# Patient Record
Sex: Female | Born: 1990 | Race: White | Hispanic: No | Marital: Single | Smoking: Current every day smoker
Health system: Southern US, Community
[De-identification: ages and names within clinical notes are randomized; demographics above are authoritative.]

## PROBLEM LIST (undated history)

## (undated) DIAGNOSIS — D241 Benign neoplasm of right breast: Secondary | ICD-10-CM

## (undated) DIAGNOSIS — Z9889 Other specified postprocedural states: Secondary | ICD-10-CM

## (undated) DIAGNOSIS — I10 Essential (primary) hypertension: Secondary | ICD-10-CM

## (undated) DIAGNOSIS — J45909 Unspecified asthma, uncomplicated: Secondary | ICD-10-CM

## (undated) DIAGNOSIS — F419 Anxiety disorder, unspecified: Secondary | ICD-10-CM

## (undated) HISTORY — DX: Anxiety disorder, unspecified: F41.9

## (undated) HISTORY — PX: BREAST LUMPECTOMY: SHX2

## (undated) HISTORY — DX: Benign neoplasm of right breast: D24.1

---

## 2016-09-06 ENCOUNTER — Emergency Department
Admission: EM | Admit: 2016-09-06 | Discharge: 2016-09-06 | Disposition: A | Discharge status: Home / Self Care | Payer: Managed Care, Other (non HMO) | Source: Home / Self Care | Attending: Family Medicine | Admitting: Family Medicine

## 2016-09-06 ENCOUNTER — Emergency Department (INDEPENDENT_AMBULATORY_CARE_PROVIDER_SITE_OTHER): Payer: Managed Care, Other (non HMO)

## 2016-09-06 DIAGNOSIS — R0602 Shortness of breath: Secondary | ICD-10-CM

## 2016-09-06 DIAGNOSIS — J069 Acute upper respiratory infection, unspecified: Secondary | ICD-10-CM | POA: Diagnosis not present

## 2016-09-06 DIAGNOSIS — F17200 Nicotine dependence, unspecified, uncomplicated: Secondary | ICD-10-CM | POA: Diagnosis not present

## 2016-09-06 DIAGNOSIS — R0789 Other chest pain: Secondary | ICD-10-CM

## 2016-09-06 DIAGNOSIS — F419 Anxiety disorder, unspecified: Secondary | ICD-10-CM

## 2016-09-06 DIAGNOSIS — F410 Panic disorder [episodic paroxysmal anxiety] without agoraphobia: Secondary | ICD-10-CM

## 2016-09-06 DIAGNOSIS — B9789 Other viral agents as the cause of diseases classified elsewhere: Secondary | ICD-10-CM | POA: Diagnosis not present

## 2016-09-06 HISTORY — DX: Essential (primary) hypertension: I10

## 2016-09-06 HISTORY — DX: Other specified postprocedural states: Z98.890

## 2016-09-06 HISTORY — DX: Unspecified asthma, uncomplicated: J45.909

## 2016-09-06 LAB — POCT INFLUENZA A/B
Influenza A, POC: NEGATIVE
Influenza B, POC: NEGATIVE

## 2016-09-06 LAB — POCT RAPID STREP A (OFFICE): Rapid Strep A Screen: NEGATIVE

## 2016-09-06 NOTE — ED Provider Notes (Signed)
CSN: [id number]     Arrival date & time 09/06/16  1837 History   First MD Initiated Contact with Patient 09/06/16 1932     Chief Complaint  Patient presents with  . Otalgia    Right  . Cough  . Sore Throat   (Consider location/radiation/quality/duration/timing/severity/associated sxs/prior Treatment) HPI  Marie Randolph is a 26 y.o. female presenting to UC with c/o Right side otalgia, subjective fever, mild nausea, mild intermittent dry cough, and fatigue that started earlier today. She reports testing positive for influenza B last week and took 4 of 5 days of prescribed Tamiflu.  Pt notes she started to get nauseated so she stopped taking the Tamiflu. Pt concerned she is getting sick with the flu again. She also has a sore throat and has had strep throat twice within the last 1-2 months. Denies hx of asthma but has hx of anxiety and is wondering if symptoms are due to her anxiety. She had an appointment with a new PCP for tomorrow but was advised they could not fit her in until next week. Pt denies hx of CAD.  No vomiting or diarrhea.    Past Medical History:  Diagnosis Date  . Asthma   . H/O lumpectomy   . Hypertension    Past Surgical History:  Procedure Laterality Date  . BREAST LUMPECTOMY    . CESAREAN SECTION     Family History  Problem Relation Age of Onset  . Hypertension Mother   . Hypertension Father    Social History  Substance Use Topics  . Smoking status: Current Every Day Smoker    Types: E-cigarettes  . Smokeless tobacco: Not on file     Comment: Vapes daily  . Alcohol use Yes     Comment: 1/week   OB History    No data available     Review of Systems  Constitutional: Positive for chills, fatigue and fever ( subjective).  HENT: Positive for congestion, ear pain (Right) and sore throat. Negative for trouble swallowing and voice change.   Respiratory: Positive for cough. Negative for shortness of breath.   Cardiovascular: Negative for chest pain and  palpitations.  Gastrointestinal: Positive for nausea. Negative for abdominal pain, diarrhea and vomiting.  Musculoskeletal: Positive for arthralgias and myalgias. Negative for back pain.  Skin: Negative for rash.  Neurological: Positive for headaches. Negative for dizziness and light-headedness.  Psychiatric/Behavioral: Negative for self-injury and suicidal ideas. The patient is nervous/anxious.     Allergies  Peanut-containing drug products and Shellfish allergy  Home Medications   Prior to Admission medications   Not on File   Meds Ordered and Administered this Visit  Medications - No data to display  BP 138/85 (BP Location: Left Arm)   Pulse 72   Temp 98 F (36.7 C) (Oral)   Ht 5\' 2"  (1.575 m)   Wt 126 lb (57.2 kg)   LMP 09/03/2016   SpO2 97%   BMI 23.05 kg/m  No data found.   Physical Exam  Constitutional: She is oriented to person, place, and time. She appears well-developed and well-nourished. No distress.  HENT:  Head: Normocephalic and atraumatic.  Right Ear: Tympanic membrane normal.  Left Ear: Tympanic membrane normal.  Nose: Nose normal.  Mouth/Throat: Uvula is midline, oropharynx is clear and moist and mucous membranes are normal.  Partially impacted, no TM erythema or edema. No mastoid tenderness.   Eyes: EOM are normal.  Neck: Normal range of motion. Neck supple.  Cardiovascular: Normal rate and  regular rhythm.   Pulmonary/Chest: Effort normal and breath sounds normal. No stridor. No respiratory distress. She has no wheezes. She has no rales. She exhibits no tenderness.  Musculoskeletal: Normal range of motion.  Lymphadenopathy:    She has no cervical adenopathy.  Neurological: She is alert and oriented to person, place, and time.  Skin: Skin is warm and dry. She is not diaphoretic.  Psychiatric: Her behavior is normal. Her mood appears anxious. She exhibits a depressed mood (tearful).  Nursing note and vitals reviewed.   Urgent Care Course      Procedures (including critical care time)  Labs Review Labs Reviewed  POCT RAPID STREP A (OFFICE)  POCT INFLUENZA A/B    Imaging Review Dg Chest 2 View  Result Date: 09/06/2016 CLINICAL DATA:  26 year old current smoker with acute onset of shortness of breath which began earlier today. Patient states that she had influenza 1 week ago. EXAM: CHEST  2 VIEW COMPARISON:  None. FINDINGS: Cardiomediastinal silhouette unremarkable. Lungs clear. Bronchovascular markings normal. Pulmonary vascularity normal. No visible pleural effusions. No pneumothorax. Thoracolumbar dextroscoliosis. IMPRESSION: No acute cardiopulmonary disease. Electronically Signed   By: Evangeline Dakin M.D.   On: 09/06/2016 19:25    MDM   1. Viral URI with cough   2. Chest tightness   3. Anxiety   4. Panic attack    Pt presenting to UC concerned for having flu again or other sort of infection.  Rapid flu: Negative CXR: normal Rapid strep: Negative  Hx and exam c/w viral URI and associated anxiety/panic attack.  Doubt emergent process taking place at this time. Vitals: WNL Strongly encouraged to call PCP in the morning for further evaluation of symptoms and to discussed anxiety.  Discussed symptoms that warrant emergent care in the ED.     Noland Fordyce, PA-C 09/06/16 2039

## 2016-09-06 NOTE — ED Triage Notes (Signed)
Had strep 4 weeks ago, then again 2 weeks ago.  Had flu B last week.  Started feeling bad around noon today.  Right ear pain, cough.

## 2016-09-09 ENCOUNTER — Encounter: Payer: Self-pay | Admitting: Physician Assistant

## 2016-09-09 ENCOUNTER — Ambulatory Visit (INDEPENDENT_AMBULATORY_CARE_PROVIDER_SITE_OTHER): Payer: Managed Care, Other (non HMO) | Admitting: Physician Assistant

## 2016-09-09 VITALS — BP 122/80 | HR 87 | Wt 128.0 lb

## 2016-09-09 DIAGNOSIS — F411 Generalized anxiety disorder: Secondary | ICD-10-CM | POA: Diagnosis not present

## 2016-09-09 DIAGNOSIS — Z0000 Encounter for general adult medical examination without abnormal findings: Secondary | ICD-10-CM | POA: Diagnosis not present

## 2016-09-09 DIAGNOSIS — N6311 Unspecified lump in the right breast, upper outer quadrant: Secondary | ICD-10-CM

## 2016-09-09 DIAGNOSIS — R030 Elevated blood-pressure reading, without diagnosis of hypertension: Secondary | ICD-10-CM | POA: Insufficient documentation

## 2016-09-09 MED ORDER — FLUOXETINE HCL 20 MG PO TABS: 20.0000 mg | ORAL_TABLET | Freq: Every day | ORAL | 1 refills | Status: DC

## 2016-09-09 MED ORDER — DIAZEPAM 2 MG PO TABS: ORAL_TABLET | ORAL | 0 refills | Status: AC

## 2016-09-09 NOTE — Progress Notes (Signed)
HPI:                                                                Marie Randolph is a 26 y.o. female who presents to [place] today to establish care  Current Concerns include anxiety, fatigue, weight gain, breast lump  Patient was seen in urgent care 3 days ago for chest tightness and diagnosed with anxiety/panic attack. She was encouraged to follow-up with a PCP. She has an upcoming trip to [place] for business at the end of the week and she is very anxious about air travel. Patient reports a long-standing history of anxiety. She has been on SSRI's, Hydroxyzine and attending counseling and states "none of these work." She states that her anxiety ruined her relationship with her child's father and interferes with her quality of life. She does find some support and relief through her church. She exercises daily.  Weight gain: patient is concerned that she weighs 128 pounds. She states that she has always weighed 124 pounds and that this is not normal for her. She adheres to a very strict diet and exercises regularly. She believes there is something wrong with her thyroid and would like to have it checked.  Right breast lumps: patient has a history of fibroadenomas of the right breast. Over the past year she has noticed two firm masses in the upper outer right breast near her surgical scar. They are nontender, but are bothering her.    Health Maintenance Health Maintenance  Topic Date Due  . HIV Screening  08/23/2004  . TETANUS/TDAP  08/23/2008  . PAP SMEAR  08/23/2010  . INFLUENZA VACCINE  03/02/2016    GYN/Sexual Health  Menstrual status: IUD  LMP: 09/03/16  Menses: spotting  Last pap smear: Oct 2017  History of abnormal pap smears: no  Sexually active: not currenlty  Current contraception: IUD  Health Habits  Diet: good  Exercise: 5 days per week, cardio and weights   Past Medical History:  Diagnosis Date  .  Anxiety   . Asthma   . H/O lumpectomy   . Hypertension    Past Surgical History:  Procedure Laterality Date  . BREAST LUMPECTOMY    . CESAREAN SECTION     Social History  Substance Use Topics  . Smoking status: Current Every Day Smoker    Types: E-cigarettes  . Smokeless tobacco: Not on file     Comment: Vapes daily  . Alcohol use Yes     Comment: 1/week   family history includes Alcohol abuse in her father; Breast cancer in her maternal aunt; Diabetes in her maternal grandmother; Hyperlipidemia in her mother; Hypertension in her father and mother.  ROS: negative except as noted in the HPI  Medications: Current Outpatient Prescriptions  Medication Sig Dispense Refill  . diazepam (VALIUM) 2 MG tablet Take 1 hour prior to air travel 10 tablet 0  . FLUoxetine (PROZAC) 20 MG tablet Take 1 tablet (20 mg total) by mouth daily. 30 tablet 1   No current facility-administered medications for this visit.    Allergies  Allergen Reactions  . Peanut-Containing Drug Products   . Shellfish Allergy        Objective:  BP 122/80   Pulse 87  Wt 128 lb (58.1 kg)   LMP 09/03/2016   BMI 23.41 kg/m  Gen: well-groomed, cooperative, not ill-appearing, no distress HEENT: normal conjunctiva, TM's clear, oropharynx clear, moist mucus membranes, no thyromegaly or tenderness Pulm: Normal work of breathing, clear to auscultation bilaterally CV: Normal rate, regular rhythm, s1 and s2 distinct, no murmurs, clicks or rubs appreciated on this exam, no carotid bruit Right Breast: 2, firm approx 0.5cm nodules in the upper outer quadrant, no axillary or supraclavicular adenopathy present GI: soft, nondistended, nontender, no masses Neuro: alert and oriented x 3, EOM's intact, PERRLA, DTR's intact MSK: strength 5/5 and symmetric, normal gait, distal pulses intact, no peripheral edema Skin: warm and dry, no rashes or lesions on exposed skin Psych: normal affect, pleasant mood, normal speech and  thought content  GAD 7 : Generalized Anxiety Score 09/09/2016  Nervous, Anxious, on Edge 3  Control/stop worrying 3  Worry too much - different things 3  Trouble relaxing 3  Restless 0  Easily annoyed or irritable 3  Afraid - awful might happen 2  Total GAD 7 Score 17    Assessment and Plan: 26 y.o. female with   1. Encounter for preventive health examination - TSH - VITAMIN D 25 Hydroxy (Vit-D Deficiency, Fractures) - Lipid Panel w/reflex Direct LDL - Comprehensive metabolic panel - CBC  2. Mass of upper outer quadrant of right breast - suspect recurrence of fibroadenoma  - US BREAST LTD UNI RIGHT INC AXILLA; Future  3. 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 Instructed not to drink alcohol or drive with this medication. Not to combine with other sedation medications. Patient expressed understanding - close follow-up in 1 month - FLUoxetine (PROZAC) 20 MG tablet; Take 1 tablet (20 mg total) by mouth daily.  Dispense: 30 tablet; Refill: 1 - diazepam (VALIUM) 2 MG tablet; Take 1 hour prior to air travel  Dispense: 10 tablet; Refill: 0  4. Elevated blood pressure reading - recheck BP improved, but she does have a family history of HTN - patient to check blood pressures at home - return with readings in 1 month  Patient education and anticipatory guidance given Patient agrees with treatment plan Follow-up in 1 month or sooner as needed  Darlyne Russian PA-C

## 2016-09-09 NOTE — Patient Instructions (Addendum)
Take 1 Valium 1 hour prior to your flight. May repeat once. Do not drink any alcohol with this medication. Do not drive while taking this medication. Do not combine with any other sedating medicines.  Start Fluoxetine 20mg  daily  Purchase a blood pressure cuff and start checking your pressures at home. Choose the same time of day when you are relaxed. Bring log to your next appointment  Follow-up with me in 1 month   How to Take Your Blood Pressure Blood pressure is a measurement of how strongly your blood is pressing against the walls of your arteries. Arteries are blood vessels that carry blood from your heart throughout your body. Your health care provider takes your blood pressure at each office visit. You can also take your own blood pressure at home with a blood pressure machine. You may need to take your own blood pressure:  To confirm a diagnosis of high blood pressure (hypertension).  To monitor your blood pressure over time.  To make sure your blood pressure medicine is working. Supplies needed: To take your blood pressure, you will need a blood pressure machine. You can buy a blood pressure machine, or blood pressure monitor, at most drugstores or online. There are several types of home blood pressure monitors. When choosing one, consider the following:  Choose a monitor that has an arm cuff.  Choose a monitor that wraps snugly around your upper arm. You should be able to fit only one finger between your arm and the cuff.  Do not choose a monitor that measures your blood pressure from your wrist or finger. Your health care provider can suggest a reliable monitor that will meet your needs. How to prepare To get the most accurate reading, avoid the following for 30 minutes before you check your blood pressure:  Drinking caffeine.  Drinking alcohol.  Eating.  Smoking.  Exercising. Five minutes before you check your blood pressure:  Empty your bladder.  Sit quietly  without talking in a dining chair, rather than in a soft couch or armchair. How to take your blood pressure To check your blood pressure, follow the instructions in the manual that came with your blood pressure monitor. If you have a digital blood pressure monitor, the instructions may be as follows: 1. Sit up straight. 2. Place your feet on the floor. Do not cross your ankles or legs. 3. Rest your left arm at the level of your heart on a table or desk or on the arm of a chair. 4. Pull up your shirt sleeve. 5. Wrap the blood pressure cuff around the upper part of your left arm, 1 inch (2.5 cm) above your elbow. It is best to wrap the cuff around bare skin. 6. Fit the cuff snugly around your arm. You should be able to place only one finger between the cuff and your arm. 7. Position the cord inside the groove of your elbow. 8. Press the power button. 9. Sit quietly while the cuff inflates and deflates. 10. Read the digital reading on the monitor screen and write it down (record it). 11. Wait 2-3 minutes, then repeat the steps starting at step 1. What does my blood pressure reading mean? A blood pressure reading is recorded as two numbers, such as "120 over 80" (or 120/80). The first ("top") number is called the systolic pressure. It is a measure of the pressure in your arteries as the heart beats. The second ("bottom") number is called the diastolic pressure. It is a  measure of the pressure in your arteries as the heart relaxes between beats. Blood pressure is classified into four stages. The following are the stages for adults who do not have a short-term serious illness or a chronic condition. Systolic pressure and diastolic pressure are measured in a unit called mm Hg. Normal  Systolic pressure: below 123456.  Diastolic pressure: below 80. Prehypertension  Systolic pressure: 123456.  Diastolic pressure: [id number]. Hypertension stage 1  Systolic pressure: A999333.  Diastolic pressure:  A999333. Hypertension stage 2  Systolic pressure: 0000000 or above.  Diastolic pressure: 123XX123 or above. You can have prehypertension or hypertension even if only the systolic or only the diastolic number in your reading is higher than normal. Follow these instructions at home:  Check your blood pressure as often as recommended by your health care provider.  Take your monitor to the next appointment with your health care provider to make sure:  That you are using it correctly.  That it provides accurate readings.  Be sure you understand what your goal blood pressure numbers are.  Tell your health care provider if you are having any side effects from blood pressure medicine. Contact a health care provider if:  Your blood pressure is consistently high. Get help right away if:  Your systolic blood pressure is higher than 180.  Your diastolic blood pressure is higher than 110. This information is not intended to replace advice given to you by your health care provider. Make sure you discuss any questions you have with your health care provider. Document Released: 12/26/2015 Document Revised: 03/09/2016 Document Reviewed: 12/26/2015 Elsevier Interactive Patient Education  2017 [employer].

## 2016-09-13 ENCOUNTER — Encounter: Payer: Self-pay | Admitting: Physician Assistant

## 2016-09-23 ENCOUNTER — Telehealth: Payer: Self-pay | Admitting: Physician Assistant

## 2016-09-23 LAB — LIPID PANEL W/REFLEX DIRECT LDL
CHOL/HDL RATIO: 2.4 ratio (ref <5.0)
Cholesterol: 127 mg/dL (ref <200)
HDL: 52 mg/dL (ref >50)
LDL-Cholesterol: 56 mg/dL
Non-HDL Cholesterol (Calc): 75 mg/dL (ref <130)
Triglycerides: 107 mg/dL (ref <150)

## 2016-09-23 LAB — COMPREHENSIVE METABOLIC PANEL
ALBUMIN: 4.4 g/dL (ref 3.6–5.1)
ALK PHOS: 56 U/L (ref 33–115)
ALT: 15 U/L (ref 6–29)
AST: 17 U/L (ref 10–30)
BILIRUBIN TOTAL: 0.5 mg/dL (ref 0.2–1.2)
BUN: 12 mg/dL (ref 7–25)
CALCIUM: 9.2 mg/dL (ref 8.6–10.2)
CO2: 23 mmol/L (ref 20–31)
CREATININE: 0.55 mg/dL (ref 0.50–1.10)
Chloride: 105 mmol/L (ref 98–110)
Glucose, Bld: 79 mg/dL (ref 65–99)
Potassium: 4.1 mmol/L (ref 3.5–5.3)
Sodium: 139 mmol/L (ref 135–146)
TOTAL PROTEIN: 7.4 g/dL (ref 6.1–8.1)

## 2016-09-23 LAB — CBC
HCT: 42.1 % (ref 35.0–45.0)
Hemoglobin: 14.2 g/dL (ref 11.7–15.5)
MCH: 32.3 pg (ref 27.0–33.0)
MCHC: 33.7 g/dL (ref 32.0–36.0)
MCV: 95.7 fL (ref 80.0–100.0)
MPV: 9.7 fL (ref 7.5–12.5)
PLATELETS: 303 10*3/uL (ref 140–400)
RBC: 4.4 MIL/uL (ref 3.80–5.10)
RDW: 13.1 % (ref 11.0–15.0)
WBC: 8.1 10*3/uL (ref 3.8–10.8)

## 2016-09-23 LAB — VITAMIN D 25 HYDROXY (VIT D DEFICIENCY, FRACTURES): VIT D 25 HYDROXY: 27 ng/mL — AB (ref 30–100)

## 2016-09-23 LAB — TSH: TSH: 0.87 m[IU]/L

## 2016-09-23 NOTE — Telephone Encounter (Signed)
Patient called about her labs and really would like for a nurse to call her asap about her results. Thanks

## 2016-09-23 NOTE — Telephone Encounter (Signed)
Thanks, [place]. Her labs were drawn yesterday and resulted today. Should she call you back, she did not have any results that were flagged abnormal, so she will get a call from a nurse/CMA by end of day today or tomorrow. You can also let her know that they were released to MyChart, so she can activate that and view them there as well.

## 2016-10-06 ENCOUNTER — Other Ambulatory Visit: Payer: Managed Care, Other (non HMO)

## 2016-10-07 ENCOUNTER — Ambulatory Visit: Payer: Managed Care, Other (non HMO) | Admitting: Physician Assistant

## 2016-11-02 ENCOUNTER — Other Ambulatory Visit: Payer: Managed Care, Other (non HMO)

## 2016-12-04 ENCOUNTER — Other Ambulatory Visit: Payer: Self-pay | Admitting: Physician Assistant

## 2016-12-04 DIAGNOSIS — F411 Generalized anxiety disorder: Secondary | ICD-10-CM

## 2017-07-17 IMAGING — DX DG CHEST 2V
2 series · 2 of 2 positions shown · non-contrast
Comparison: None.

CLINICAL DATA: 26-year-old current smoker with acute onset of
shortness of breath which began earlier today. Patient states that
she had influenza 1 week ago.

EXAM:
CHEST  2 VIEW

[chest pa]
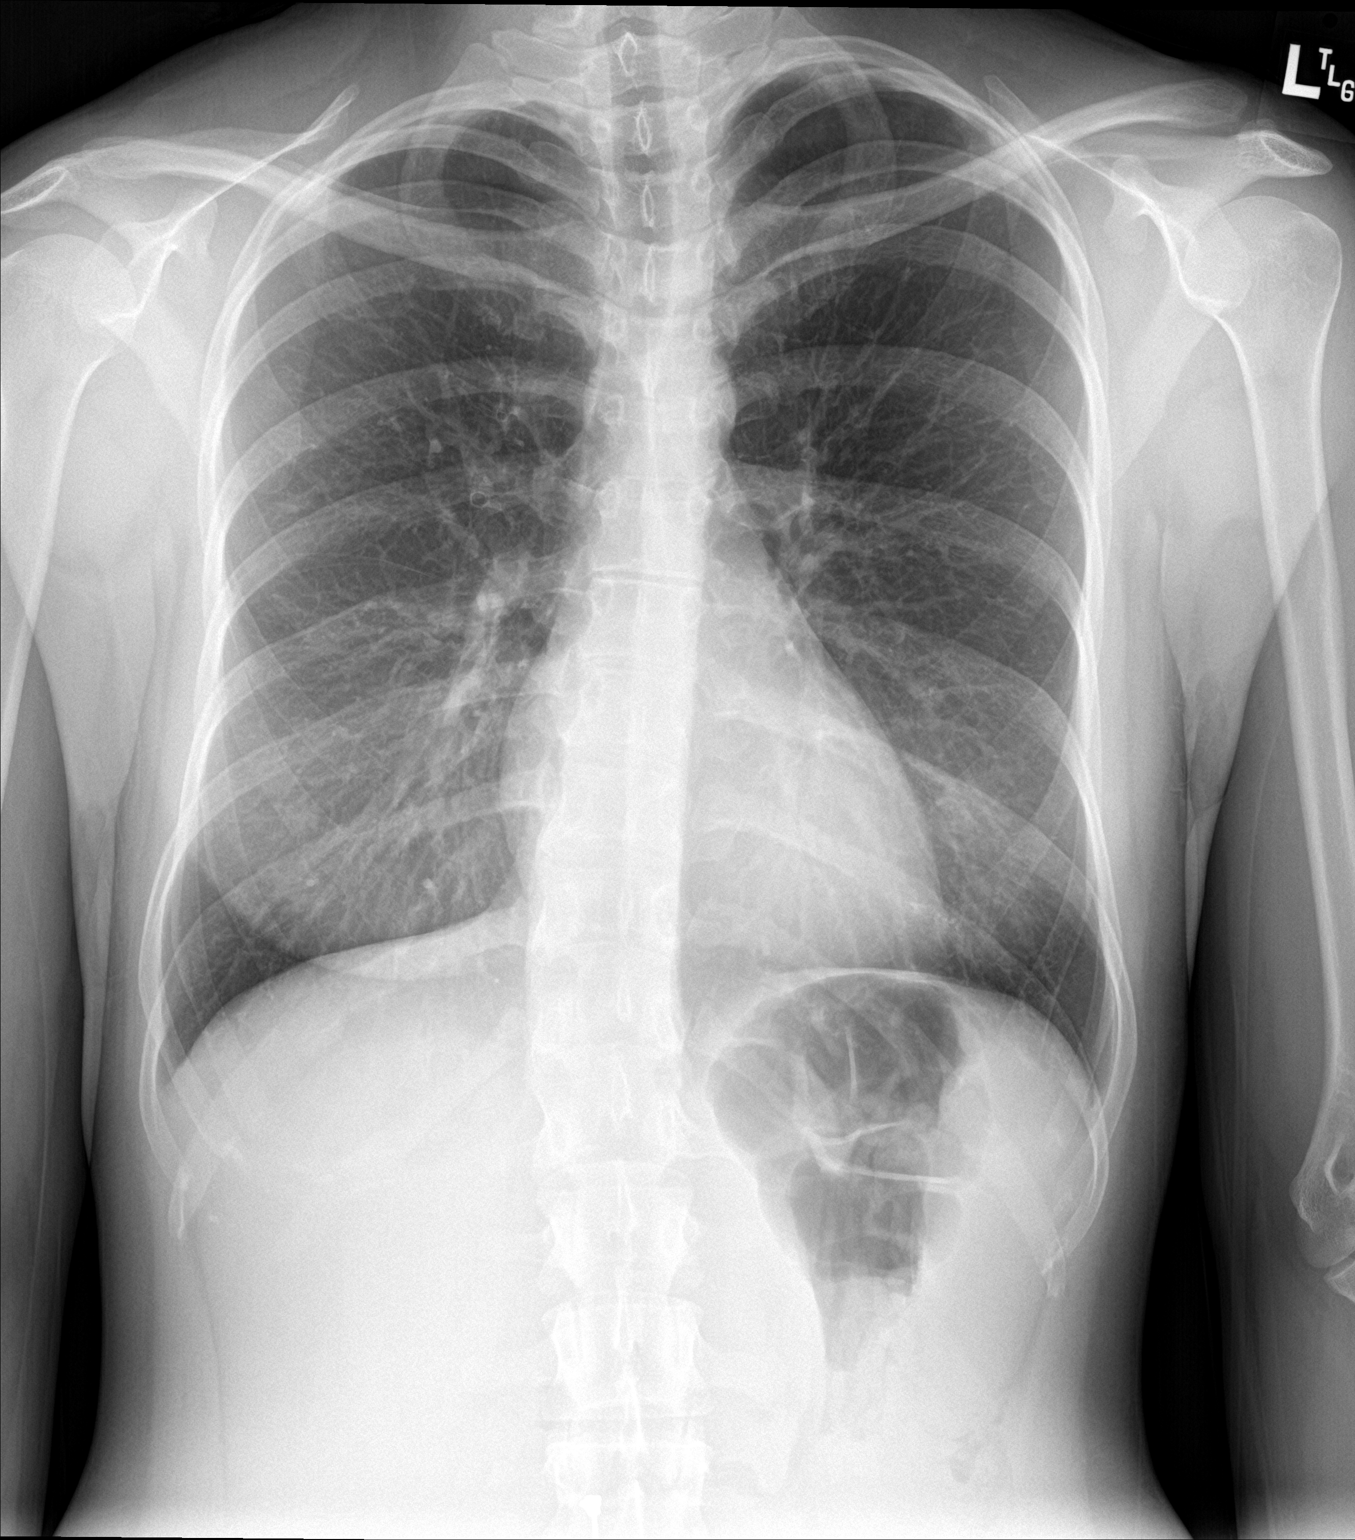

[chest lat]
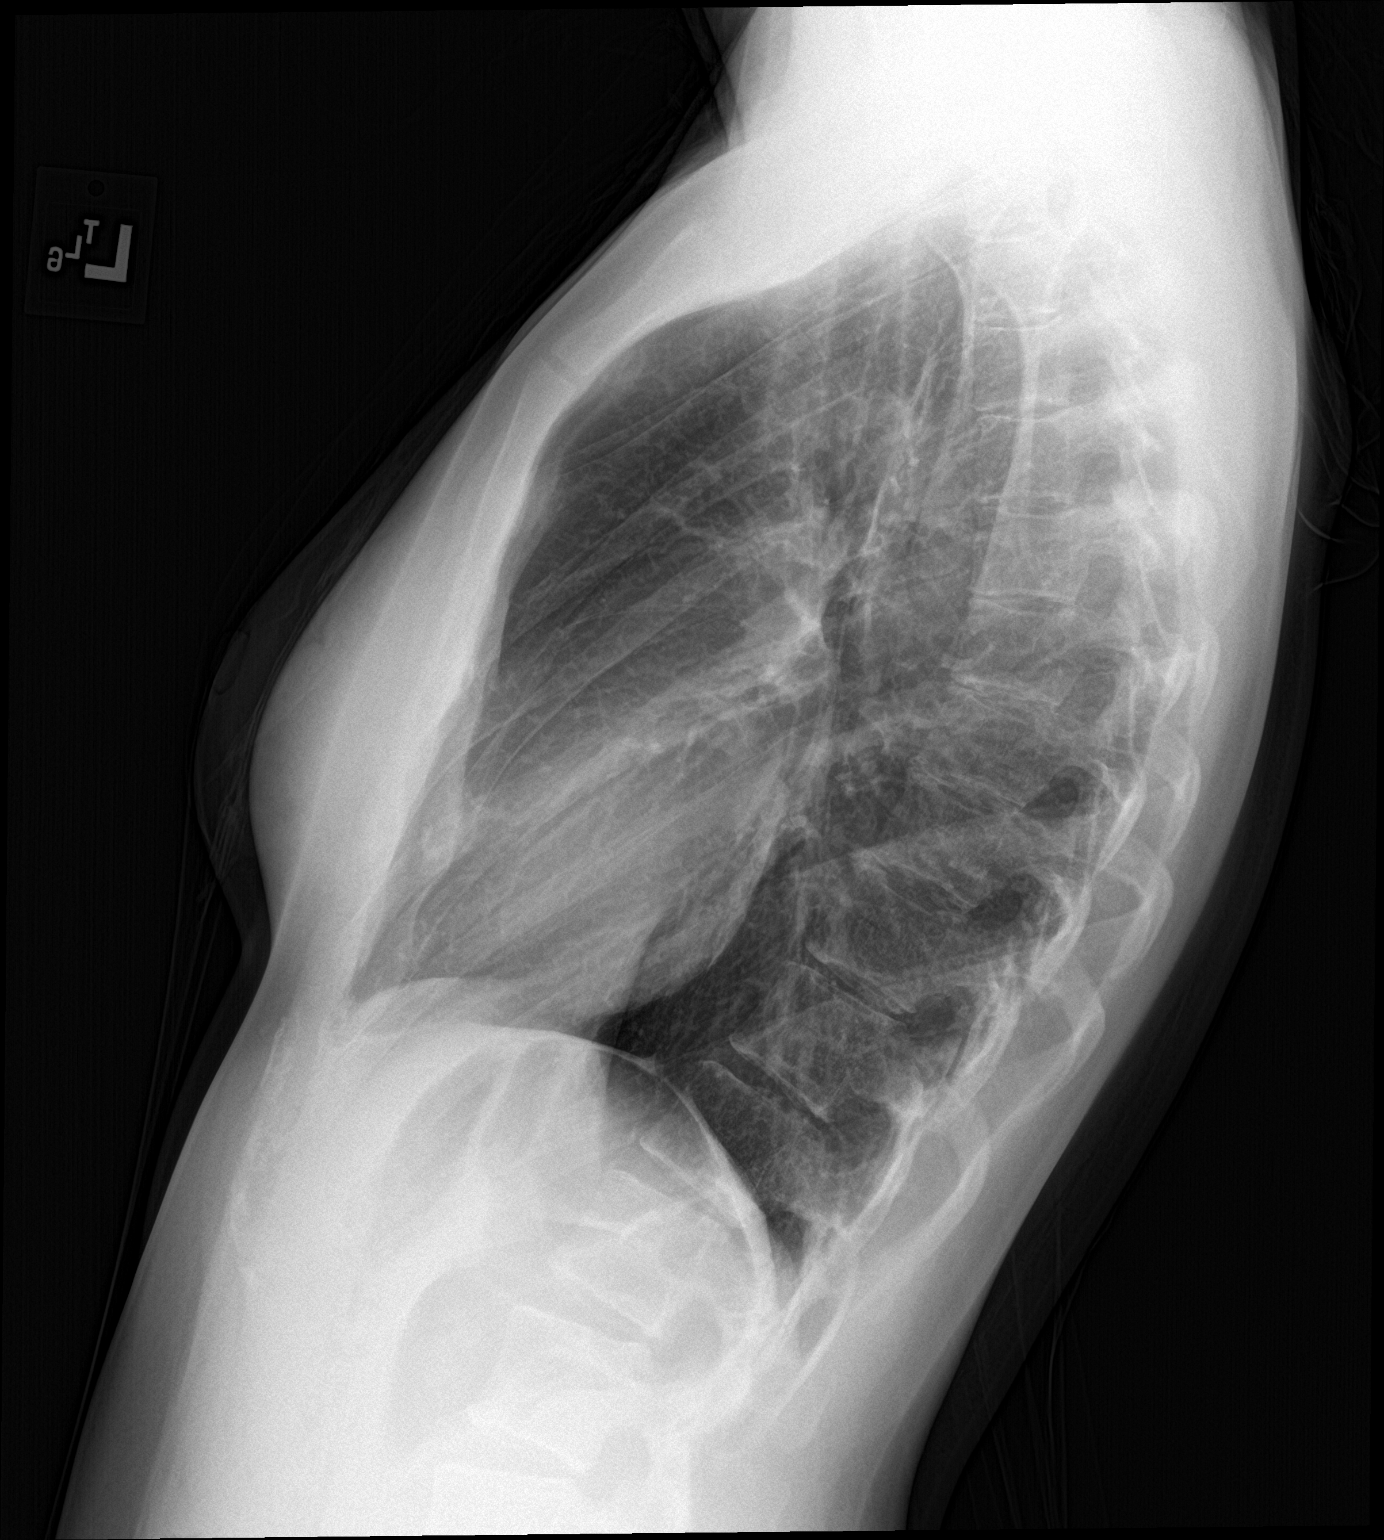

[2 of 2 positions shown; findings below may reference images not displayed]

FINDINGS: Cardiomediastinal silhouette unremarkable. Lungs clear.
Bronchovascular markings normal. Pulmonary vascularity normal. No
visible pleural effusions. No pneumothorax. Thoracolumbar
dextroscoliosis.
IMPRESSION: No acute cardiopulmonary disease.

## 2017-12-24 ENCOUNTER — Other Ambulatory Visit: Payer: Self-pay

## 2017-12-24 ENCOUNTER — Emergency Department (INDEPENDENT_AMBULATORY_CARE_PROVIDER_SITE_OTHER)
Admission: EM | Admit: 2017-12-24 | Discharge: 2017-12-24 | Disposition: A | Discharge status: Home / Self Care | Payer: Managed Care, Other (non HMO) | Source: Home / Self Care | Attending: Emergency Medicine | Admitting: Emergency Medicine

## 2017-12-24 DIAGNOSIS — H1031 Unspecified acute conjunctivitis, right eye: Secondary | ICD-10-CM | POA: Diagnosis not present

## 2017-12-24 DIAGNOSIS — H00011 Hordeolum externum right upper eyelid: Secondary | ICD-10-CM

## 2017-12-24 MED ORDER — CEPHALEXIN 500 MG PO CAPS: 500.0000 mg | ORAL_CAPSULE | Freq: Two times a day (BID) | ORAL | 0 refills | Status: DC

## 2017-12-24 MED ORDER — GENTAMICIN SULFATE 0.3 % OP SOLN: 2.0000 [drp] | OPHTHALMIC | 0 refills | Status: DC

## 2017-12-24 MED ORDER — POLYMYXIN B-TRIMETHOPRIM 10000-0.1 UNIT/ML-% OP SOLN: OPHTHALMIC | 0 refills | Status: DC

## 2017-12-24 NOTE — ED Provider Notes (Signed)
[facility]    CSN: [id number] Arrival date & time: 12/24/17  1345     History   Chief Complaint Chief Complaint  Patient presents with  . Conjunctivitis    HPI Jalaysha Skilton is a 27 y.o. female.   HPI Pt c/o swollen R eyelid for 3 days.  It is getting red and irritated.  Started draining slight yellow discharge yesterday.  This morning, woke up with eye swollen shut .  She feels right upper lid stye, feels inflamed and enlarging. No other head or HEENT symptoms.  No fever or chills or nausea or vomiting.  Currently, denies chest pain or shortness of breath.  No abdominal pain.  No focal neurologic symptoms or syncope. Denies vision problems.   Past Medical History:  Diagnosis Date  . Anxiety   . Asthma   . Fibroadenoma of right breast   . H/O lumpectomy   . Hypertension     Patient Active Problem List   Diagnosis Date Noted  . Mass of upper outer quadrant of right breast 09/09/2016  . GAD (generalized anxiety disorder) 09/09/2016  . Elevated blood pressure reading 09/09/2016    Past Surgical History:  Procedure Laterality Date  . BREAST LUMPECTOMY    . CESAREAN SECTION      OB History   None   Patient's last menstrual period was 12/09/2017. She denies chance of pregnancy   Home Medications    Prior to Admission medications   Medication Sig Start Date End Date Taking? Authorizing Provider  cephALEXin (KEFLEX) 500 MG capsule Take 1 capsule (500 mg total) by mouth 2 (two) times daily for 7 days. 12/24/17 12/31/17  Jacqulyn Cane, MD  diazepam (VALIUM) 2 MG tablet Take 1 hour prior to air travel 09/09/16   Trixie Dredge, PA-C  FLUoxetine (PROZAC) 20 MG tablet Take 1 tablet (20 mg total) by mouth daily. Due for follow up visit 12/06/16   Trixie Dredge, PA-C  gentamicin (GARAMYCIN) 0.3 % ophthalmic solution Place 2 drops into the right eye every 4 (four) hours. For 7 days . 12/24/17   Jacqulyn Cane, MD    Family  History Family History  Problem Relation Age of Onset  . Hypertension Mother   . Hyperlipidemia Mother   . Hypertension Father   . Alcohol abuse Father   . Breast cancer Maternal Aunt   . Diabetes Maternal Grandmother     Social History Social History   Tobacco Use  . Smoking status: Current Every Day Smoker    Types: E-cigarettes  . Smokeless tobacco: Never Used  . Tobacco comment: Vapes daily  Substance Use Topics  . Alcohol use: Yes    Comment: 1/week  . Drug use: No     Allergies   Peanut-containing drug products and Shellfish allergy   Review of Systems Review of Systems  All other systems reviewed and are negative.  See also HPI  Physical Exam Triage Vital Signs ED Triage Vitals [12/24/17 1416]  Enc Vitals Group     BP 120/82     Pulse Rate 83     Resp      Temp 98.4 F (36.9 C)     Temp Source Oral     SpO2 98 %     Weight 146 lb (66.2 kg)     Height 5\' 2"  (1.575 m)     Head Circumference      Peak Flow      Pain Score 0  Pain Loc      Pain Edu?      Excl. in [place]?    No data found.  Updated Vital Signs BP 120/82 (BP Location: Right Arm)   Pulse 83   Temp 98.4 F (36.9 C) (Oral)   Ht 5\' 2"  (1.575 m)   Wt 146 lb (66.2 kg)   LMP 12/09/2017   SpO2 98%   BMI 26.70 kg/m   Visual Acuity Right Eye Distance: 20/40 Left Eye Distance: 20/30 Bilateral Distance: 20/30  Right Eye Near:   Left Eye Near:    Bilateral Near:     Physical Exam  Constitutional: She is oriented to person, place, and time. She appears well-developed and well-nourished. No distress.  HENT:  Head: Normocephalic and atraumatic. Head is without right periorbital erythema and without left periorbital erythema.  Right Ear: Tympanic membrane, external ear and ear canal normal.  Left Ear: Tympanic membrane, external ear and ear canal normal.  Nose: Nose normal.  Mouth/Throat: Oropharynx is clear and moist. No oral lesions.  Eyes: Pupils are equal, round, and reactive  to light. EOM are normal. Right eye exhibits discharge and exudate. Right eye exhibits no hordeolum. No foreign body present in the right eye. Left eye exhibits no discharge, no exudate and no hordeolum. No foreign body present in the left eye. Right conjunctiva is injected. Right conjunctiva has no hemorrhage. Left conjunctiva is not injected. Left conjunctiva has no hemorrhage.  There is a inflamed, rubbery red stye 4 x 4 mm right upper eyelid, mildly tender to palpation.  No drainage from the stye.  No fluctuance  Neck: Neck supple.  Lymphadenopathy:    She has no cervical adenopathy.  Neurological: She is alert and oriented to person, place, and time.  Skin: No rash noted.  Psychiatric: She has a normal mood and affect.     UC Treatments / Results  Labs (all labs ordered are listed, but only abnormal results are displayed) Labs Reviewed - No data to display  EKG None  Radiology No results found.  Procedures Procedures (including critical care time)  Medications Ordered in UC Medications - No data to display  Initial Impression / Assessment and Plan / UC Course  I have reviewed the triage vital signs and the nursing notes.  Pertinent labs & imaging results that were available during my care of the patient were reviewed by me and considered in my medical decision making (see chart for details).     Final Clinical Impressions(s) / UC Diagnoses   Final diagnoses:  Acute bacterial conjunctivitis of right eye  Hordeolum externum of right upper eyelid     Discharge Instructions     Cause for your right eye pink eye is likely bacterial conjunctivitis and infected sty right upper lid. Please read attached information sheets on conjunctivitis and stye. Using eyedrops as prescribed. Take the cephalexin antibiotic by mouth twice a day for 7 days. Apply warm compresses to right eye twice a day. Follow-up with your eye doctor if not improving in one week, or sooner if worse or  new symptoms.   AVS printed and given to patient and verbal instructions given. Questions invited and answered. After patient was discharged, pharmacy called stating that no Polytrim eyedrops available, therefore changed prescription for Polytrim eyedrops to gentamicin eyedrops.  Pharmacist conveyed information to patient. ED Prescriptions    Medication Sig Dispense Auth. Provider   trimethoprim-polymyxin b (POLYTRIM) ophthalmic solution  (Status: Discontinued) 2 drop in affected eye(s) every 4  hours (while awake) x 7 days 10 mL Jacqulyn Cane, MD   cephALEXin (KEFLEX) 500 MG capsule Take 1 capsule (500 mg total) by mouth 2 (two) times daily for 7 days. 14 capsule Jacqulyn Cane, MD   gentamicin (GARAMYCIN) 0.3 % ophthalmic solution Place 2 drops into the right eye every 4 (four) hours. For 7 days . 5 mL Jacqulyn Cane, MD        Jacqulyn Cane, MD 12/24/17 [phone]

## 2017-12-24 NOTE — ED Triage Notes (Signed)
Pt c/o swollen R eyelid since Wednesday. Irritated and draining. Woke up with eye swollen shut this morning. Continues to get worse and possibly traveling to other eye.

## 2017-12-24 NOTE — Discharge Instructions (Signed)
Cause for your right eye pink eye is likely bacterial conjunctivitis and infected sty right upper lid. Please read attached information sheets on conjunctivitis and stye. Using eyedrops as prescribed. Take the cephalexin antibiotic by mouth twice a day for 7 days. Apply warm compresses to right eye twice a day. Follow-up with your eye doctor if not improving in one week, or sooner if worse or new symptoms.

## 2017-12-26 ENCOUNTER — Other Ambulatory Visit: Payer: Self-pay

## 2017-12-26 ENCOUNTER — Telehealth: Payer: Self-pay | Admitting: *Deleted

## 2017-12-26 ENCOUNTER — Emergency Department (INDEPENDENT_AMBULATORY_CARE_PROVIDER_SITE_OTHER)
Admission: EM | Admit: 2017-12-26 | Discharge: 2017-12-26 | Disposition: A | Discharge status: Home / Self Care | Payer: Managed Care, Other (non HMO) | Source: Home / Self Care

## 2017-12-26 DIAGNOSIS — R21 Rash and other nonspecific skin eruption: Secondary | ICD-10-CM

## 2017-12-26 MED ORDER — DOXYCYCLINE HYCLATE 100 MG PO TABS: 100.0000 mg | ORAL_TABLET | Freq: Two times a day (BID) | ORAL | 0 refills | Status: AC

## 2017-12-26 MED ORDER — VALACYCLOVIR HCL 1 G PO TABS: 1000.0000 mg | ORAL_TABLET | Freq: Three times a day (TID) | ORAL | 0 refills | Status: AC

## 2017-12-26 NOTE — Telephone Encounter (Signed)
Pt called reports that she is still having eye drainage and itching. She has now developed whelps under her eye. She is concerned that se may be having an allergic reaction. Advised her to return to the clinic for reevaluation.

## 2017-12-26 NOTE — ED Triage Notes (Signed)
Pt saw Dr Burnett Harry 2 days ago, and this am has 2 sores under the right eye

## 2017-12-26 NOTE — ED Provider Notes (Signed)
[place]   [id number] 12/26/17 Arrival Time: 1520   SUBJECTIVE:  Marie Randolph is a 27 y.o. female who presents to the urgent care with complaint of rash on lower right eyelid.  Patient states that this eruption just began today after having a stye and conjunctivitis diagnosed two days ago.  The eruption is accompanied by pain in the entire right lower lid and on closer inspection includes 2 small vesicles.  Although patient is 27 years old, she did have chickenpox when she was young.  Past Medical History:  Diagnosis Date  . Anxiety   . Asthma   . Fibroadenoma of right breast   . H/O lumpectomy   . Hypertension    Family History  Problem Relation Age of Onset  . Hypertension Mother   . Hyperlipidemia Mother   . Hypertension Father   . Alcohol abuse Father   . Breast cancer Maternal Aunt   . Diabetes Maternal Grandmother    Social History   Socioeconomic History  . Marital status: Single    Spouse name: Not on file  . Number of children: Not on file  . Years of education: Not on file  . Highest education level: Not on file  Occupational History  . Not on file  Social Needs  . Financial resource strain: Not on file  . Food insecurity:    Worry: Not on file    Inability: Not on file  . Transportation needs:    Medical: Not on file    Non-medical: Not on file  Tobacco Use  . Smoking status: Current Every Day Smoker    Types: E-cigarettes  . Smokeless tobacco: Never Used  . Tobacco comment: Vapes daily  Substance and Sexual Activity  . Alcohol use: Yes    Comment: 1/week  . Drug use: No  . Sexual activity: Yes    Birth control/protection: IUD  Lifestyle  . Physical activity:    Days per week: Not on file    Minutes per session: Not on file  . Stress: Not on file  Relationships  . Social connections:    Talks on phone: Not on file    Gets together: Not on file    Attends religious service: Not on file    Active member of club or  organization: Not on file    Attends meetings of clubs or organizations: Not on file    Relationship status: Not on file  . Intimate partner violence:    Fear of current or ex partner: Not on file    Emotionally abused: Not on file    Physically abused: Not on file    Forced sexual activity: Not on file  Other Topics Concern  . Not on file  Social History Narrative  . Not on file   No outpatient medications have been marked as taking for the 12/26/17 encounter [facility] Encounter).   Allergies  Allergen Reactions  . Peanut-Containing Drug Products   . Penicillins   . Shellfish Allergy       ROS: As per HPI, remainder of ROS negative.   OBJECTIVE:   Vitals:   12/26/17 1535 12/26/17 1537  BP: 118/76   Pulse: 90   Temp: 98.4 F (36.9 C)   TempSrc: Oral   SpO2: 100%   Weight:  148 lb (67.1 kg)  Height:  5\' 2"  (1.575 m)     General appearance: alert; no distress Eyes: PERRL; EOMI; conjunctiva normal; right lower lid is mildly swollen, diffusely erythematous,  in the center of the erythema contains 2 small 1 mm vesicles. HENT: normocephalic; atraumatic; oral mucosa normal Neck: supple Back: no CVA tenderness Extremities: no cyanosis or edema; symmetrical with no gross deformities Skin: warm and dry Neurologic: normal gait; grossly normal Psychological: alert and cooperative; normal mood and affect      Labs:  Results for orders placed or performed in visit on 09/09/16  TSH  Result Value Ref Range   TSH 0.87 mIU/L  VITAMIN D 25 Hydroxy (Vit-D Deficiency, Fractures)  Result Value Ref Range   Vit D, 25-Hydroxy 27 (L) 30 - 100 ng/mL  Lipid Panel w/reflex Direct LDL  Result Value Ref Range   Cholesterol 127 <200 mg/dL   Triglycerides 107 <150 mg/dL   HDL 52 >50 mg/dL   Total CHOL/HDL Ratio 2.4 <5.0 Ratio   Non-HDL Cholesterol (Calc) 75 <130 mg/dL   LDL-Cholesterol 56 mg/dL  Comprehensive metabolic panel  Result Value Ref Range   Sodium 139 135 - 146  mmol/L   Potassium 4.1 3.5 - 5.3 mmol/L   Chloride 105 98 - 110 mmol/L   CO2 23 20 - 31 mmol/L   Glucose, Bld 79 65 - 99 mg/dL   BUN 12 7 - 25 mg/dL   Creat 0.55 0.50 - 1.10 mg/dL   Total Bilirubin 0.5 0.2 - 1.2 mg/dL   Alkaline Phosphatase 56 33 - 115 U/L   AST 17 10 - 30 U/L   ALT 15 6 - 29 U/L   Total Protein 7.4 6.1 - 8.1 g/dL   Albumin 4.4 3.6 - 5.1 g/dL   Calcium 9.2 8.6 - 10.2 mg/dL  CBC  Result Value Ref Range   WBC 8.1 3.8 - 10.8 K/uL   RBC 4.40 3.80 - 5.10 MIL/uL   Hemoglobin 14.2 11.7 - 15.5 g/dL   HCT 42.1 35.0 - 45.0 %   MCV 95.7 80.0 - 100.0 fL   MCH 32.3 27.0 - 33.0 pg   MCHC 33.7 32.0 - 36.0 g/dL   RDW 13.1 11.0 - 15.0 %   Platelets 303 140 - 400 K/uL   MPV 9.7 7.5 - 12.5 fL    Labs Reviewed - No data to display  No results found.     ASSESSMENT & PLAN:  1. Rash and nonspecific skin eruption   The rash is consistent with either a minor staph infection (spread from the upper eyelid stye) or a mild case of shingles.  Since you had the chicken pox, shingles is possible.  Therefore, I am writing you a prescription for both doxycycline (for staph) and Valtrex (for the shingles virus)  Meds ordered this encounter  Medications  . doxycycline (VIBRA-TABS) 100 MG tablet    Sig: Take 1 tablet (100 mg total) by mouth 2 (two) times daily.    Dispense:  14 tablet    Refill:  0  . valACYclovir (VALTREX) 1000 MG tablet    Sig: Take 1 tablet (1,000 mg total) by mouth 3 (three) times daily.    Dispense:  21 tablet    Refill:  0    Reviewed expectations re: course of current medical issues. Questions answered. Outlined signs and symptoms indicating need for more acute intervention. Patient verbalized understanding. After Visit Summary given.    Procedures:      Robyn Haber, MD 12/26/17 1610

## 2017-12-26 NOTE — Discharge Instructions (Addendum)
The rash is consistent with either a minor staph infection (spread from the upper eyelid stye) or a mild case of shingles.  Since you had the chicken pox, shingles is possible.  Therefore, I am writing you a prescription for both doxycycline (for staph) and Valtrex (for the shingles virus)

## 2021-01-29 ENCOUNTER — Emergency Department
Admission: EM | Admit: 2021-01-29 | Discharge: 2021-01-29 | Disposition: A | Discharge status: Home / Self Care | Payer: Managed Care, Other (non HMO) | Source: Home / Self Care

## 2021-01-29 ENCOUNTER — Encounter: Payer: Self-pay | Admitting: Family Medicine

## 2021-01-29 ENCOUNTER — Other Ambulatory Visit: Payer: Self-pay

## 2021-01-29 DIAGNOSIS — N3001 Acute cystitis with hematuria: Secondary | ICD-10-CM

## 2021-01-29 LAB — POCT URINALYSIS DIP (MANUAL ENTRY)
Glucose, UA: 100 mg/dL — AB
Nitrite, UA: POSITIVE — AB
Protein Ur, POC: 30 mg/dL — AB
Spec Grav, UA: 1.025 (ref 1.010–1.025)
Urobilinogen, UA: 1 E.U./dL
pH, UA: 5.5 (ref 5.0–8.0)

## 2021-01-29 MED ORDER — NITROFURANTOIN MONOHYD MACRO 100 MG PO CAPS: 100.0000 mg | ORAL_CAPSULE | Freq: Two times a day (BID) | ORAL | 0 refills | Status: AC

## 2021-01-29 NOTE — Discharge Instructions (Addendum)
Advised patient to take medication as directed to completion.  Advised we will follow-up with urine culture results once received.  Advised strongly encourage patient to follow-up with urology for further evaluation, cystoscopy, bladder studies, and/or other.

## 2021-01-29 NOTE — ED Triage Notes (Signed)
Chronic dysuria x 6 months  R flank pain  AZO x 3 days  Treated w/ bactrim by OB 2 weeks ago  Finished 3 days ago - pt was not seen by OB/GYN - no culture was done  OTC - aleve this am & last night Has a referral for Urology - no appointment made

## 2021-01-29 NOTE — ED Provider Notes (Signed)
[facility]    CSN: [id number] Arrival date & time: 01/29/21  1928      History   Chief Complaint Chief Complaint  Patient presents with   Dysuria    HPI Gailene Youkhana is a 30 y.o. female.   HPI 30 year old female Zentz with chronic dysuria x6 months with right flank pain, patient reports taking Azo for 3 days.  Reports treated by OB/GYN with Bactrim 2 weeks ago and finished this course 3 days ago no urine culture was performed.  Patient has been referred to urology for further evaluation; however, has not made appointment.  Past Medical History:  Diagnosis Date   Anxiety    Asthma    Fibroadenoma of right breast    H/O lumpectomy    Hypertension     Patient Active Problem List   Diagnosis Date Noted   Mass of upper outer quadrant of right breast 09/09/2016   GAD (generalized anxiety disorder) 09/09/2016   Elevated blood pressure reading 09/09/2016    Past Surgical History:  Procedure Laterality Date   BREAST LUMPECTOMY     CESAREAN SECTION      OB History   No obstetric history on file.      Home Medications    Prior to Admission medications   Medication Sig Start Date End Date Taking? Authorizing Provider  nitrofurantoin, macrocrystal-monohydrate, (MACROBID) 100 MG capsule Take 1 capsule (100 mg total) by mouth 2 (two) times daily. 01/29/21  Yes Eliezer Lofts, FNP  diazepam (VALIUM) 2 MG tablet Take 1 hour prior to air travel 09/09/16   Trixie Dredge, PA-C  doxycycline (VIBRA-TABS) 100 MG tablet Take 1 tablet (100 mg total) by mouth 2 (two) times daily. Patient not taking: Reported on 01/29/2021 12/26/17   Robyn Haber, MD  FLUoxetine (PROZAC) 20 MG tablet Take 1 tablet (20 mg total) by mouth daily. Due for follow up visit Patient not taking: Reported on 01/29/2021 12/06/16   Trixie Dredge, PA-C  valACYclovir (VALTREX) 1000 MG tablet Take 1 tablet (1,000 mg total) by mouth 3 (three) times daily. 12/26/17   Robyn Haber, MD    Family History Family History  Problem Relation Age of Onset   Hypertension Mother    Hyperlipidemia Mother    Hypertension Father    Alcohol abuse Father    Diabetes Maternal Grandmother    Breast cancer Maternal Aunt     Social History Social History   Tobacco Use   Smoking status: Every Day    Pack years: 0.00    Types: E-cigarettes   Smokeless tobacco: Never   Tobacco comments:    Vapes daily  Vaping Use   Vaping Use: Never used  Substance Use Topics   Alcohol use: Yes    Comment: 1/week   Drug use: No     Allergies   Peanut-containing drug products, Penicillins, and Shellfish allergy   Review of Systems Review of Systems  Genitourinary:  Positive for dysuria and flank pain.    Physical Exam Triage Vital Signs ED Triage Vitals  Enc Vitals Group     BP 01/29/21 1949 (!) 122/91     Pulse Rate 01/29/21 1949 83     Resp 01/29/21 1949 18     Temp 01/29/21 1949 98 F (36.7 C)     Temp Source 01/29/21 1949 Oral     SpO2 01/29/21 1949 96 %     Weight 01/29/21 1950 158 lb (71.7 kg)     Height 01/29/21 1950  5\' 2"  (1.575 m)     Head Circumference --      Peak Flow --      Pain Score 01/29/21 1944 4     Pain Loc --      Pain Edu? --      Excl. in [place]? --    No data found.  Updated Vital Signs BP (!) 122/91 (BP Location: Right Arm)   Pulse 83   Temp 98 F (36.7 C) (Oral)   Resp 18   Ht 5\' 2"  (1.575 m)   Wt 158 lb (71.7 kg)   LMP 01/20/2021 (Approximate)   SpO2 96%   BMI 28.90 kg/m   Physical Exam Constitutional:      General: She is not in acute distress.    Appearance: Normal appearance. She is obese. She is not ill-appearing.  HENT:     Head: Normocephalic and atraumatic.     Mouth/Throat:     Mouth: Mucous membranes are moist.     Pharynx: Oropharynx is clear.  Eyes:     Extraocular Movements: Extraocular movements intact.     Conjunctiva/sclera: Conjunctivae normal.     Pupils: Pupils are equal, round, and reactive to  light.  Cardiovascular:     Rate and Rhythm: Normal rate and regular rhythm.     Pulses: Normal pulses.     Heart sounds: Normal heart sounds.  Pulmonary:     Effort: Pulmonary effort is normal. No respiratory distress.     Breath sounds: Normal breath sounds. No wheezing, rhonchi or rales.  Abdominal:     Tenderness: There is no right CVA tenderness or left CVA tenderness.  Musculoskeletal:        General: Normal range of motion.     Cervical back: Normal range of motion and neck supple.  Skin:    General: Skin is warm and dry.  Neurological:     General: No focal deficit present.     Mental Status: She is alert and oriented to person, place, and time.  Psychiatric:        Mood and Affect: Mood normal.        Behavior: Behavior normal.     UC Treatments / Results  Labs (all labs ordered are listed, but only abnormal results are displayed) Labs Reviewed  POCT URINALYSIS DIP (MANUAL ENTRY) - Abnormal; Notable for the following components:      Result Value   Color, UA orange (*)    Clarity, UA cloudy (*)    Glucose, UA =100 (*)    Bilirubin, UA small (*)    Ketones, POC UA trace (5) (*)    Blood, UA moderate (*)    Protein Ur, POC =30 (*)    Nitrite, UA Positive (*)    Leukocytes, UA Large (3+) (*)    All other components within normal limits  URINE CULTURE    EKG   Radiology No results found.  Procedures Procedures (including critical care time)  Medications Ordered in UC Medications - No data to display  Initial Impression / Assessment and Plan / UC Course  I have reviewed the triage vital signs and the nursing notes.  Pertinent labs & imaging results that were available during my care of the patient were reviewed by me and considered in my medical decision making (see chart for details).    MDM: 1.  Acute cystitis with hematuria-Rx'd Macrobid, urine culture ordered.  Discharged home hemodynamically stable. Final Clinical Impressions(s) / UC Diagnoses  Final diagnoses:  Acute cystitis with hematuria     Discharge Instructions      Advised patient to take medication as directed to completion.  Advised we will follow-up with urine culture results once received.  Advised strongly encourage patient to follow-up with urology for further evaluation, cystoscopy, bladder studies, and/or other.     ED Prescriptions     Medication Sig Dispense Auth. Provider   nitrofurantoin, macrocrystal-monohydrate, (MACROBID) 100 MG capsule Take 1 capsule (100 mg total) by mouth 2 (two) times daily. 10 capsule Eliezer Lofts, FNP      PDMP not reviewed this encounter.   Eliezer Lofts, [place] 01/29/21 2037

## 2021-01-31 LAB — URINE CULTURE
MICRO NUMBER:: 12073444
SPECIMEN QUALITY:: ADEQUATE

## 2021-02-09 ENCOUNTER — Telehealth: Payer: Self-pay | Admitting: Emergency Medicine

## 2021-02-09 NOTE — Telephone Encounter (Signed)
Call from [facility] regarding ongoing urinary symptoms- pt stated she had blood in urine and dysuria. Pt is driving back from [facility] and was tearful on the phone w/ RN. Pt stated she needed a stronger antibiotic and was told by provider to call back if the current one did not work. Current provider is not the one who saw the patient. Chart reviewed with current provider who requested pt be rechecked for UTI. Pt confirmed she was not allergic to PCN (daughter's allergy). Rn informed pt that [facility] was open until 8pm if she wanted to be seen once she was home today. Pt remained tearful even with options, stated she had a urology appointment in the future & was unable to get an appointment w/ her PCP. Pt stated she was frustrated and hung up on RN
